# Patient Record
Sex: Female | Born: 2000 | Race: White | Hispanic: No | Marital: Single | Smoking: Never smoker
Health system: Southern US, Community
[De-identification: ages and names within clinical notes are randomized; demographics above are authoritative.]

## PROBLEM LIST (undated history)

## (undated) DIAGNOSIS — F32A Depression, unspecified: Secondary | ICD-10-CM

## (undated) DIAGNOSIS — F419 Anxiety disorder, unspecified: Secondary | ICD-10-CM

## (undated) HISTORY — PX: TONSILLECTOMY: SUR1361

## (undated) HISTORY — DX: Depression, unspecified: F32.A

## (undated) HISTORY — DX: Anxiety disorder, unspecified: F41.9

---
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
## 2019-03-14 ENCOUNTER — Emergency Department (HOSPITAL_BASED_OUTPATIENT_CLINIC_OR_DEPARTMENT_OTHER)
Admission: EM | Admit: 2019-03-14 | Discharge: 2019-03-14 | Disposition: A | Discharge status: Home / Self Care | Payer: No Typology Code available for payment source | Attending: Emergency Medicine | Admitting: Emergency Medicine

## 2019-03-14 ENCOUNTER — Encounter (HOSPITAL_BASED_OUTPATIENT_CLINIC_OR_DEPARTMENT_OTHER): Payer: Self-pay

## 2019-03-14 ENCOUNTER — Emergency Department (HOSPITAL_BASED_OUTPATIENT_CLINIC_OR_DEPARTMENT_OTHER): Payer: No Typology Code available for payment source

## 2019-03-14 ENCOUNTER — Other Ambulatory Visit: Payer: Self-pay

## 2019-03-14 DIAGNOSIS — Y92410 Unspecified street and highway as the place of occurrence of the external cause: Secondary | ICD-10-CM | POA: Insufficient documentation

## 2019-03-14 DIAGNOSIS — Y9389 Activity, other specified: Secondary | ICD-10-CM | POA: Diagnosis not present

## 2019-03-14 DIAGNOSIS — S161XXA Strain of muscle, fascia and tendon at neck level, initial encounter: Secondary | ICD-10-CM | POA: Diagnosis not present

## 2019-03-14 DIAGNOSIS — Y999 Unspecified external cause status: Secondary | ICD-10-CM | POA: Diagnosis not present

## 2019-03-14 DIAGNOSIS — S199XXA Unspecified injury of neck, initial encounter: Secondary | ICD-10-CM | POA: Diagnosis present
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
## 2019-03-14 NOTE — ED Notes (Signed)
Patient transported to X-ray 
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
## 2019-03-14 NOTE — Discharge Instructions (Addendum)
Ibuprofen 600 mg every 6 hours as needed for pain.  Follow-up with your primary doctor if you are not improving in the next week.

## 2019-03-21 ENCOUNTER — Ambulatory Visit: Payer: Self-pay | Admitting: Internal Medicine

## 2019-03-21 ENCOUNTER — Encounter: Payer: Self-pay | Admitting: Internal Medicine

## 2019-03-21 ENCOUNTER — Other Ambulatory Visit: Payer: Self-pay

## 2019-03-21 VITALS — BP 124/82 | HR 102 | Temp 98.6°F | Wt 183.0 lb

## 2019-03-21 DIAGNOSIS — M542 Cervicalgia: Secondary | ICD-10-CM

## 2019-03-21 DIAGNOSIS — M546 Pain in thoracic spine: Secondary | ICD-10-CM

## 2019-03-21 DIAGNOSIS — M25512 Pain in left shoulder: Secondary | ICD-10-CM

## 2019-03-21 MED ORDER — CYCLOBENZAPRINE HCL 5 MG PO TABS: 5.0000 mg | ORAL_TABLET | Freq: Three times a day (TID) | ORAL | 0 refills | Status: DC | PRN

## 2019-03-21 NOTE — Patient Instructions (Signed)
Neck Exercises  Neck exercises can be important for many reasons:   They can help you to improve and maintain flexibility in your neck. This can be especially important as you age.   They can help to make your neck stronger. This can make movement easier.   They can reduce or prevent neck pain.   They may help your upper back.  Ask your health care provider which neck exercises would be best for you.  Exercises to improve neck flexibility  Neck stretch  Repeat this exercise 3-5 times.  1. Do this exercise while standing or while sitting in a chair.  2. Place your feet flat on the floor, shoulder-width apart.  3. Slowly turn your head to the right. Turn it all the way to the right so you can look over your right shoulder. Do not tilt or tip your head.  4. Hold this position for 10-30 seconds.  5. Slowly turn your head to the left, to look over your left shoulder.  6. Hold this position for 10-30 seconds.    Neck retraction  Repeat this exercise 8-10 times. Do this 3-4 times a day or as told by your health care provider.  1. Do this exercise while standing or while sitting in a sturdy chair.  2. Look straight ahead. Do not bend your neck.  3. Use your fingers to push your chin backward. Do not bend your neck for this movement. Continue to face straight ahead. If you are doing the exercise properly, you will feel a slight sensation in your throat and a stretch at the back of your neck.  4. Hold the stretch for 1-2 seconds. Relax and repeat.  Exercises to improve neck strength  Neck press  Repeat this exercise 10 times. Do it first thing in the morning and right before bed or as told by your health care provider.  1. Lie on your back on a firm bed or on the floor with a pillow under your head.  2. Use your neck muscles to push your head down on the pillow and straighten your spine.  3. Hold the position as well as you can. Keep your head facing up and your chin tucked.  4. Slowly count to 5 while holding this  position.  5. Relax for a few seconds. Then repeat.  Isometric strengthening  Do a full set of these exercises 2 times a day or as told by your health care provider.  1. Sit in a supportive chair and place your hand on your forehead.  2. Push forward with your head and neck while pushing back with your hand. Hold for 10 seconds.  3. Relax. Then repeat the exercise 3 times.  4. Next, do thesequence again, this time putting your hand against the back of your head. Use your head and neck to push backward against the hand pressure.  5. Finally, do the same exercise on either side of your head, pushing sideways against the pressure of your hand.  Prone head lifts  Repeat this exercise 5 times. Do this 2 times a day or as told by your health care provider.  1. Lie face-down, resting on your elbows so that your chest and upper back are raised.  2. Start with your head facing downward, near your chest. Position your chin either on or near your chest.  3. Slowly lift your head upward. Lift until you are looking straight ahead. Then continue lifting your head as far back as you   can stretch.  4. Hold your head up for 5 seconds. Then slowly lower it to your starting position.  Supine head lifts  Repeat this exercise 8-10 times. Do this 2 times a day or as told by your health care provider.  1. Lie on your back, bending your knees to point to the ceiling and keeping your feet flat on the floor.  2. Lift your head slowly off the floor, raising your chin toward your chest.  3. Hold for 5 seconds.  4. Relax and repeat.  Scapular retraction  Repeat this exercise 5 times. Do this 2 times a day or as told by your health care provider.  1. Stand with your arms at your sides. Look straight ahead.  2. Slowly pull both shoulders backward and downward until you feel a stretch between your shoulder blades in your upper back.  3. Hold for 10-30 seconds.  4. Relax and repeat.  Contact a health care provider if:   Your neck pain or  discomfort gets much worse when you do an exercise.   Your neck pain or discomfort does not improve within 2 hours after you exercise.  If you have any of these problems, stop exercising right away. Do not do the exercises again unless your health care provider says that you can.  Get help right away if:   You develop sudden, severe neck pain. If this happens, stop exercising right away. Do not do the exercises again unless your health care provider says that you can.  This information is not intended to replace advice given to you by your health care provider. Make sure you discuss any questions you have with your health care provider.  Document Released: 09/07/2015 Document Revised: 01/30/2018 Document Reviewed: 04/06/2015  Elsevier Interactive Patient Education  2019 [employer]
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
## 2020-11-27 ENCOUNTER — Encounter: Payer: Self-pay | Admitting: Internal Medicine

## 2020-11-27 ENCOUNTER — Ambulatory Visit: Payer: Self-pay | Admitting: Internal Medicine

## 2020-11-27 ENCOUNTER — Other Ambulatory Visit: Payer: Self-pay

## 2020-11-27 VITALS — BP 118/76 | HR 89 | Temp 97.6°F | Wt 170.0 lb

## 2020-11-27 DIAGNOSIS — F419 Anxiety disorder, unspecified: Secondary | ICD-10-CM

## 2020-11-27 DIAGNOSIS — F32A Depression, unspecified: Secondary | ICD-10-CM

## 2020-11-27 MED ORDER — ESCITALOPRAM OXALATE 5 MG PO TABS: 5.0000 mg | ORAL_TABLET | Freq: Every day | ORAL | 1 refills | Status: DC

## 2020-11-27 MED ORDER — HYDROXYZINE HCL 10 MG PO TABS: 10.0000 mg | ORAL_TABLET | Freq: Every day | ORAL | 0 refills | Status: DC | PRN

## 2020-11-27 NOTE — Assessment & Plan Note (Signed)
Deteriorated Support offered today She declines referral for therapy Discussed medication management, side effects, risk of SI RX for Lexapro 5 mg QHS RX for Hydroxyzine 10 mg daily prn for breakthrough  Update me in 1 month via mychart

## 2020-11-27 NOTE — Progress Notes (Signed)
Subjective:    Patient ID: Brandy House, female    DOB: [birth year], 19 y.o.   MRN: [id number]  HPI  Pt presents to the clinic today with c/o anxiety and depression. She reports this has been a persistent issue for years but seemed to worsen after she moved to [place] in July. She reports general increase in stress between work/school and reports traffic causes her major anxiety, and the traffic in [place] is terrible. She has never been treated for anxiety in the past. She denies SI/HI.   Review of Systems      No past medical history on file.  Current Outpatient Medications  Medication Sig Dispense Refill  . cyclobenzaprine (FLEXERIL) 5 MG tablet Take 1 tablet (5 mg total) by mouth 3 (three) times daily as needed for muscle spasms. 30 tablet 0   No current facility-administered medications for this visit.    No Known Allergies  No family history on file.  Social History   Socioeconomic History  . Marital status: Single    Spouse name: Not on file  . Number of children: Not on file  . Years of education: Not on file  . Highest education level: Not on file  Occupational History  . Not on file  Tobacco Use  . Smoking status: Never Smoker  . Smokeless tobacco: Never Used  Vaping Use  . Vaping Use: Never used  Substance and Sexual Activity  . Alcohol use: Yes    Comment: occ  . Drug use: Never  . Sexual activity: Not on file  Other Topics Concern  . Not on file  Social History Narrative  . Not on file   Social Determinants of Health   Financial Resource Strain: Not on file  Food Insecurity: Not on file  Transportation Needs: Not on file  Physical Activity: Not on file  Stress: Not on file  Social Connections: Not on file  Intimate Partner Violence: Not on file     Constitutional: Denies fever, malaise, fatigue, headache or abrupt weight changes.  Respiratory: Denies difficulty breathing, shortness of breath, cough or sputum production.    Cardiovascular: Denies chest pain, chest tightness, palpitations or swelling in the hands or feet.  Neurological: Denies dizziness, difficulty with memory, difficulty with speech or problems with balance and coordination.  Psych: Pt reports anxiety. Denies depression, SI/HI.  No other specific complaints in a complete review of systems (except as listed in HPI above).  Objective:   Physical Exam  BP 118/76   Pulse 89   Temp 97.6 F (36.4 C) (Temporal)   Wt 170 lb (77.1 kg)   LMP 10/29/2020   SpO2 98%   BMI 23.71 kg/m   Wt Readings from Last 3 Encounters:  03/21/19 183 lb (83 kg) (95 %, Z= 1.69)*  03/14/19 184 lb (83.5 kg) (96 %, Z= 1.71)*  07/18/13 157 lb 10.1 oz (71.5 kg) (96 %, Z= 1.79)*   * Growth percentiles are based on CDC (Girls, 2-20 Years) data.    General: Appears her stated age, obese, in NAD. Cardiovascular: Normal rate and rhythm. S1,S2 noted.  No murmur, rubs or gallops noted.  Pulmonary/Chest: Normal effort and positive vesicular breath sounds. No respiratory distress. No wheezes, rales or ronchi noted.  Neurological: Alert and oriented. Cranial nerves II-XII grossly intact. Coordination normal.  Psychiatric: Mildly anxious appearing, tearful. Behavior is normal. Judgment and thought content normal.      Assessment & Plan:    Nicki Reaper, NP This visit occurred  during the SARS-CoV-2 public health emergency.  Safety protocols were in place, including screening questions prior to the visit, additional usage of staff PPE, and extensive cleaning of exam room while observing appropriate contact time as indicated for disinfecting solutions.

## 2020-11-27 NOTE — Patient Instructions (Signed)
[email or web address]">  Generalized Anxiety Disorder, Adult Generalized anxiety disorder (GAD) is a mental health condition. Unlike normal worries, anxiety related to GAD is not triggered by a specific event. These worries do not fade or get better with time. GAD interferes with relationships, work, and school. GAD symptoms can vary from mild to severe. People with severe GAD can have intense waves of anxiety with physical symptoms that are similar to panic attacks. What are the causes? The exact cause of GAD is not known, but the following are believed to have an impact:  Differences in natural brain chemicals.  Genes passed down from parents to children.  Differences in the way threats are perceived.  Development during childhood.  Personality. What increases the risk? The following factors may make you more likely to develop this condition:  Being female.  Having a family history of anxiety disorders.  Being very shy.  Experiencing very stressful life events, such as the death of a loved one.  Having a very stressful family environment. What are the signs or symptoms? People with GAD often worry excessively about many things in their lives, such as their health and family. Symptoms may also include:  Mental and emotional symptoms: ? Worrying excessively about natural disasters. ? Fear of being late. ? Difficulty concentrating. ? Fears that others are judging your performance.  Physical symptoms: ? Fatigue. ? Headaches, muscle tension, muscle twitches, trembling, or feeling shaky. ? Feeling like your heart is pounding or beating very fast. ? Feeling out of breath or like you cannot take a deep breath. ? Having trouble falling asleep or staying asleep, or experiencing restlessness. ? Sweating. ? Nausea, diarrhea, or irritable bowel syndrome (IBS).  Behavioral symptoms: ? Experiencing erratic moods or irritability. ? Avoidance of new situations. ? Avoidance of  people. ? Extreme difficulty making decisions. How is this diagnosed? This condition is diagnosed based on your symptoms and medical history. You will also have a physical exam. Your health care provider may perform tests to rule out other possible causes of your symptoms. To be diagnosed with GAD, a person must have anxiety that:  Is out of his or her control.  Affects several different aspects of his or her life, such as work and relationships.  Causes distress that makes him or her unable to take part in normal activities.  Includes at least three symptoms of GAD, such as restlessness, fatigue, trouble concentrating, irritability, muscle tension, or sleep problems. Before your health care provider can confirm a diagnosis of GAD, these symptoms must be present more days than they are not, and they must last for 6 months or longer. How is this treated? This condition may be treated with:  Medicine. Antidepressant medicine is usually prescribed for long-term daily control. Anti-anxiety medicines may be added in severe cases, especially when panic attacks occur.  Talk therapy (psychotherapy). Certain types of talk therapy can be helpful in treating GAD by providing support, education, and guidance. Options include: ? Cognitive behavioral therapy (CBT). People learn coping skills and self-calming techniques to ease their physical symptoms. They learn to identify unrealistic thoughts and behaviors and to replace them with more appropriate thoughts and behaviors. ? Acceptance and commitment therapy (ACT). This treatment teaches people how to be mindful as a way to cope with unwanted thoughts and feelings. ? Biofeedback. This process trains you to manage your body's response (physiological response) through breathing techniques and relaxation methods. You will work with a therapist while machines are used to monitor your physical   symptoms.  Stress management techniques. These include yoga,  meditation, and exercise. A mental health specialist can help determine which treatment is best for you. Some people see improvement with one type of therapy. However, other people require a combination of therapies.   Follow these instructions at home: Lifestyle  Maintain a consistent routine and schedule.  Anticipate stressful situations. Create a plan, and allow extra time to work with your plan.  Practice stress management or self-calming techniques that you have learned from your therapist or your health care provider. General instructions  Take over-the-counter and prescription medicines only as told by your health care provider.  Understand that you are likely to have setbacks. Accept this and be kind to yourself as you persist to take better care of yourself.  Recognize and accept your accomplishments, even if you judge them as small.  Keep all follow-up visits as told by your health care provider. This is important. Contact a health care provider if:  Your symptoms do not get better.  Your symptoms get worse.  You have signs of depression, such as: ? A persistently sad or irritable mood. ? Loss of enjoyment in activities that used to bring you joy. ? Change in weight or eating. ? Changes in sleeping habits. ? Avoiding friends or family members. ? Loss of energy for normal tasks. ? Feelings of guilt or worthlessness. Get help right away if:  You have serious thoughts about hurting yourself or others. If you ever feel like you may hurt yourself or others, or have thoughts about taking your own life, get help right away. Go to your nearest emergency department or:  Call your local emergency services (911 in the [place]  Call a suicide crisis helpline, such as the National Suicide Prevention Lifeline at 1-800-273-8255. This is open 24 hours a day in the [place]  Text the Crisis Text Line at 741741 (in the [place] Summary  Generalized anxiety disorder (GAD) is a mental  health condition that involves worry that is not triggered by a specific event.  People with GAD often worry excessively about many things in their lives, such as their health and family.  GAD may cause symptoms such as restlessness, trouble concentrating, sleep problems, frequent sweating, nausea, diarrhea, headaches, and trembling or muscle twitching.  A mental health specialist can help determine which treatment is best for you. Some people see improvement with one type of therapy. However, other people require a combination of therapies. This information is not intended to replace advice given to you by your health care provider. Make sure you discuss any questions you have with your health care provider. Document Revised: 07/17/2019 Document Reviewed: 07/17/2019 Elsevier Patient Education  2021 Elsevier Inc.  

## 2020-12-25 ENCOUNTER — Encounter: Payer: Self-pay | Admitting: Internal Medicine

## 2021-01-17 ENCOUNTER — Other Ambulatory Visit: Payer: Self-pay | Admitting: Internal Medicine

## 2021-02-17 ENCOUNTER — Other Ambulatory Visit: Payer: Self-pay | Admitting: Internal Medicine

## 2021-04-10 ENCOUNTER — Encounter: Payer: Self-pay | Admitting: Internal Medicine

## 2021-04-18 ENCOUNTER — Other Ambulatory Visit: Payer: Self-pay | Admitting: Internal Medicine

## 2021-04-19 NOTE — Telephone Encounter (Signed)
Requested medication (s) are due for refill today: Yes  Requested medication (s) are on the active medication list: Yes  Last refill:  01/18/21  Future visit scheduled: No  Notes to clinic:  Left pt. Message to call and make appointment.    Requested Prescriptions  Pending Prescriptions Disp Refills   escitalopram (LEXAPRO) 5 MG tablet [Pharmacy Med Name: ESCITALOPRAM 5 MG TABLET] 90 tablet 1    Sig: TAKE 1 TABLET (5 MG TOTAL) BY MOUTH DAILY.      There is no refill protocol information for this order

## 2021-05-28 ENCOUNTER — Encounter: Payer: Self-pay | Admitting: Internal Medicine

## 2021-06-02 MED ORDER — ESCITALOPRAM OXALATE 10 MG PO TABS: 10.0000 mg | ORAL_TABLET | Freq: Every day | ORAL | 1 refills | Status: DC

## 2021-07-09 ENCOUNTER — Other Ambulatory Visit: Payer: Self-pay | Admitting: Internal Medicine

## 2021-08-29 ENCOUNTER — Other Ambulatory Visit: Payer: Self-pay | Admitting: Internal Medicine

## 2021-08-29 NOTE — Telephone Encounter (Signed)
Requested medication (s) are due for refill today: yes  Requested medication (s) are on the active medication list: yes  Last refill:  02/19/21 #90 1 RF  Future visit scheduled: no  Notes to clinic:  pt of Nicki Reaper NP; med not assigned to a protocol   Requested Prescriptions  Pending Prescriptions Disp Refills   hydrOXYzine (ATARAX/VISTARIL) 10 MG tablet [Pharmacy Med Name: HYDROXYZINE HCL 10 MG TABLET] 90 tablet 1    Sig: TAKE 1 TABLET BY MOUTH EVERY DAY AS NEEDED     There is no refill protocol information for this order

## 2021-10-12 ENCOUNTER — Encounter: Payer: Self-pay | Admitting: Internal Medicine

## 2021-10-15 ENCOUNTER — Ambulatory Visit: Payer: Self-pay | Admitting: Internal Medicine

## 2021-10-15 ENCOUNTER — Other Ambulatory Visit: Payer: Self-pay

## 2021-10-15 ENCOUNTER — Encounter: Payer: Self-pay | Admitting: Internal Medicine

## 2021-10-15 DIAGNOSIS — F32A Depression, unspecified: Secondary | ICD-10-CM

## 2021-10-15 DIAGNOSIS — F419 Anxiety disorder, unspecified: Secondary | ICD-10-CM

## 2021-10-15 MED ORDER — ESCITALOPRAM OXALATE 20 MG PO TABS: 20.0000 mg | ORAL_TABLET | Freq: Every day | ORAL | 1 refills | Status: DC

## 2021-10-15 NOTE — Progress Notes (Signed)
Subjective:    Patient ID: Brandy House, female    DOB: March 24, 2001, 21 y.o.   MRN: [id number]  HPI  Pt presents to the clinic today for follow up of anxiety and depression. This is currently managed on Escitalopram and Hydroxyzine. She does feel like her anxiety has gotten worse lately. She reports the depression is still there but not worse. She is not currently seeing a therapist but has been considering this. She denies SI/HI.  Review of Systems     No past medical history on file.  Current Outpatient Medications  Medication Sig Dispense Refill   escitalopram (LEXAPRO) 10 MG tablet Take 1 tablet (10 mg total) by mouth daily. 90 tablet 1   hydrOXYzine (ATARAX/VISTARIL) 10 MG tablet TAKE 1 TABLET BY MOUTH EVERY DAY AS NEEDED 90 tablet 0   levonorgestrel-ethinyl estradiol (ALESSE) 0.1-20 MG-MCG tablet Take 1 tablet by mouth daily.     No current facility-administered medications for this visit.    No Known Allergies  No family history on file.  Social History   Socioeconomic History   Marital status: Single    Spouse name: Not on file   Number of children: Not on file   Years of education: Not on file   Highest education level: Not on file  Occupational History   Not on file  Tobacco Use   Smoking status: Never   Smokeless tobacco: Never  Vaping Use   Vaping Use: Never used  Substance and Sexual Activity   Alcohol use: Yes    Comment: occ   Drug use: Never   Sexual activity: Not on file  Other Topics Concern   Not on file  Social History Narrative   Not on file   Social Determinants of Health   Financial Resource Strain: Not on file  Food Insecurity: Not on file  Transportation Needs: Not on file  Physical Activity: Not on file  Stress: Not on file  Social Connections: Not on file  Intimate Partner Violence: Not on file     Constitutional: Denies fever, malaise, fatigue, headache or abrupt weight changes.  Respiratory: Denies difficulty breathing,  shortness of breath, cough or sputum production.   Cardiovascular: Denies chest pain, chest tightness, palpitations or swelling in the hands or feet.  Neurological: Denies dizziness, difficulty with memory, difficulty with speech or problems with balance and coordination.  Psych: Pt has a history of anxiety and depression. Denies SI/HI.  No other specific complaints in a complete review of systems (except as listed in HPI above).  Objective:   Physical Exam BP 115/78 (BP Location: Right Arm, Patient Position: Sitting, Cuff Size: Normal)    Pulse 74    Temp (!) 97.3 F (36.3 C) (Temporal)    Resp 18    Ht 5\' 11"  (1.803 m)    Wt 189 lb 9.6 oz (86 kg)    LMP 10/05/2021    SpO2 100%    BMI 26.44 kg/m   Wt Readings from Last 3 Encounters:  11/27/20 170 lb (77.1 kg)  03/21/19 183 lb (83 kg) (95 %, Z= 1.69)*  03/14/19 184 lb (83.5 kg) (96 %, Z= 1.71)*   * Growth percentiles are based on CDC (Girls, 2-20 Years) data.    General: Appears her stated age, well developed, well nourished in NAD. Cardiovascular: Normal rate and rhythm.  Pulmonary/Chest: Normal effort and positive vesicular breath sounds.  Musculoskeletal: No difficulty with gait.  Neurological: Alert and oriented.  Psychiatric: Mood and affect normal.  Mildly anxious appearing. Judgment and thought content normal.       Assessment & Plan:     Nicki Reaper, NP This visit occurred during the SARS-CoV-2 public health emergency.  Safety protocols were in place, including screening questions prior to the visit, additional usage of staff PPE, and extensive cleaning of exam room while observing appropriate contact time as indicated for disinfecting solutions.

## 2021-10-15 NOTE — Assessment & Plan Note (Signed)
Deteriorated Increase Escitalopram to 20 mg daily Continue Hydroxyzine as needed She is considering therapy through [email or web address] Support offered

## 2021-10-15 NOTE — Patient Instructions (Signed)

## 2021-11-16 ENCOUNTER — Encounter: Payer: Self-pay | Admitting: Internal Medicine

## 2022-04-21 ENCOUNTER — Other Ambulatory Visit: Payer: Self-pay | Admitting: Internal Medicine

## 2022-04-21 NOTE — Telephone Encounter (Signed)
Called pt , LMOM to return call to schedule appt. ?

## 2022-04-21 NOTE — Telephone Encounter (Signed)
Courtesy refill. Patient will need an office visit for further refills. Requested Prescriptions  Pending Prescriptions Disp Refills  . escitalopram (LEXAPRO) 20 MG tablet [Pharmacy Med Name: ESCITALOPRAM 20 MG TABLET] 30 tablet 0    Sig: TAKE 1 TABLET BY MOUTH EVERY DAY     Psychiatry:  Antidepressants - SSRI Failed - 04/21/2022  3:27 AM      Failed - Valid encounter within last 6 months    Recent Outpatient Visits          6 months ago Anxiety and depression   [facility] [place], [place], NP             Passed - Completed PHQ-2 or PHQ-9 in the last 360 days

## 2022-05-02 ENCOUNTER — Encounter: Payer: Self-pay | Admitting: Internal Medicine

## 2022-05-02 ENCOUNTER — Telehealth (INDEPENDENT_AMBULATORY_CARE_PROVIDER_SITE_OTHER): Payer: Self-pay | Admitting: Internal Medicine

## 2022-05-02 DIAGNOSIS — F419 Anxiety disorder, unspecified: Secondary | ICD-10-CM

## 2022-05-02 DIAGNOSIS — F32A Depression, unspecified: Secondary | ICD-10-CM

## 2022-05-02 MED ORDER — HYDROXYZINE HCL 10 MG PO TABS: 10.0000 mg | ORAL_TABLET | Freq: Every day | ORAL | 3 refills | Status: DC | PRN

## 2022-05-02 MED ORDER — ESCITALOPRAM OXALATE 20 MG PO TABS: 20.0000 mg | ORAL_TABLET | Freq: Every day | ORAL | 3 refills | Status: DC

## 2022-05-02 NOTE — Assessment & Plan Note (Signed)
Controlled on her current dose of escitalopram and hydroxyzine, refilled today Support offered

## 2022-05-02 NOTE — Progress Notes (Signed)
Virtual Visit via Video Note  I connected with Brandy House on 05/02/22 at 11:20 AM EDT by a video enabled telemedicine application and verified that I am speaking with the correct person using two identifiers.  Location: Patient: Home Provider: Office  Persons participating in this video call: Nicki Reaper, NP and Lucius Conn.   I discussed the limitations of evaluation and management by telemedicine and the availability of in person appointments. The patient expressed understanding and agreed to proceed.  History of Present Illness:  Patient due for follow-up of anxiety and depression.  This is a chronic issue which is currently managed on Escitalopram and Hydroxyzine.  She is not currently seeing a therapist but did do therapy through [email or web address].  She denies SI/HI.   No past medical history on file.  Current Outpatient Medications  Medication Sig Dispense Refill   escitalopram (LEXAPRO) 20 MG tablet TAKE 1 TABLET BY MOUTH EVERY DAY 30 tablet 0   hydrOXYzine (ATARAX/VISTARIL) 10 MG tablet TAKE 1 TABLET BY MOUTH EVERY DAY AS NEEDED 90 tablet 0   No current facility-administered medications for this visit.    No Known Allergies  No family history on file.  Social History   Socioeconomic History   Marital status: Single    Spouse name: Not on file   Number of children: Not on file   Years of education: Not on file   Highest education level: Not on file  Occupational History   Not on file  Tobacco Use   Smoking status: Never   Smokeless tobacco: Never  Vaping Use   Vaping Use: Never used  Substance and Sexual Activity   Alcohol use: Yes    Comment: occ   Drug use: Never   Sexual activity: Not on file  Other Topics Concern   Not on file  Social History Narrative   Not on file   Social Determinants of Health   Financial Resource Strain: Not on file  Food Insecurity: Not on file  Transportation Needs: Not on file  Physical Activity: Not on file   Stress: Not on file  Social Connections: Not on file  Intimate Partner Violence: Not on file     Constitutional: Denies fever, malaise, fatigue, headache or abrupt weight changes.  Respiratory: Denies difficulty breathing, shortness of breath, cough or sputum production.   Cardiovascular: Denies chest pain, chest tightness, palpitations or swelling in the hands or feet.   Neurological: Denies dizziness, difficulty with memory, difficulty with speech or problems with balance and coordination.  Psych: Patient has a history of anxiety and depression.  Denies SI/HI.  No other specific complaints in a complete review of systems (except as listed in HPI above).  Observations/Objective:  Wt Readings from Last 3 Encounters:  10/15/21 189 lb 9.6 oz (86 kg)  11/27/20 170 lb (77.1 kg)  03/21/19 183 lb (83 kg) (95 %, Z= 1.69)*   * Growth percentiles are based on CDC (Girls, 2-20 Years) data.    General: Appears her stated age, well developed, well nourished in NAD. Pulmonary/Chest: Normal effort. No respiratory distress.  Neurological: Alert and oriented.  Psychiatric: Mood and affect normal. Behavior is normal. Judgment and thought content normal.       Assessment and Plan:  RTC in 1 year for your annual exam  Follow Up Instructions:    I discussed the assessment and treatment plan with the patient. The patient was provided an opportunity to ask questions and all were answered. The patient agreed with the  plan and demonstrated an understanding of the instructions.   The patient was advised to call back or seek an in-person evaluation if the symptoms worsen or if the condition fails to improve as anticipated.    Nicki Reaper, NP

## 2022-05-02 NOTE — Patient Instructions (Signed)

## 2022-06-01 ENCOUNTER — Encounter: Payer: Self-pay | Admitting: Internal Medicine

## 2022-06-21 ENCOUNTER — Encounter: Payer: Self-pay | Admitting: Internal Medicine

## 2022-06-21 ENCOUNTER — Ambulatory Visit: Payer: Self-pay | Admitting: Internal Medicine

## 2022-06-21 DIAGNOSIS — F419 Anxiety disorder, unspecified: Secondary | ICD-10-CM

## 2022-06-21 DIAGNOSIS — F32A Depression, unspecified: Secondary | ICD-10-CM

## 2022-06-21 MED ORDER — HYDROXYZINE PAMOATE 25 MG PO CAPS: 25.0000 mg | ORAL_CAPSULE | Freq: Three times a day (TID) | ORAL | 1 refills | Status: DC | PRN

## 2022-06-21 MED ORDER — BUPROPION HCL ER (XL) 150 MG PO TB24: 150.0000 mg | ORAL_TABLET | Freq: Every day | ORAL | 1 refills | Status: DC

## 2022-06-21 NOTE — Progress Notes (Signed)
Subjective:    Patient ID: Brandy House, female    DOB: [birth year], 21 y.o.   MRN: [id number]  HPI  Patient presents to clinic today for follow-up of anxiety and depression.  This is a chronic issue currently managed on Escitalopram and Hydroxyzine.  She feels like this has been worse lately due to irrational fears about losing her job, having a bad day at work. She reports her house is dirty because she has no motivation to get up an clean it. She is not currently seeing a therapist, mainly due to financial limitations.  She denies SI/HI.  Review of Systems     No past medical history on file.  Current Outpatient Medications  Medication Sig Dispense Refill   escitalopram (LEXAPRO) 20 MG tablet Take 1 tablet (20 mg total) by mouth daily. 90 tablet 3   hydrOXYzine (ATARAX) 10 MG tablet Take 1 tablet (10 mg total) by mouth daily as needed. 90 tablet 3   No current facility-administered medications for this visit.    No Known Allergies  No family history on file.  Social History   Socioeconomic History   Marital status: Single    Spouse name: Not on file   Number of children: Not on file   Years of education: Not on file   Highest education level: Not on file  Occupational History   Not on file  Tobacco Use   Smoking status: Never   Smokeless tobacco: Never  Vaping Use   Vaping Use: Never used  Substance and Sexual Activity   Alcohol use: Yes    Comment: occ   Drug use: Never   Sexual activity: Not on file  Other Topics Concern   Not on file  Social History Narrative   Not on file   Social Determinants of Health   Financial Resource Strain: Not on file  Food Insecurity: Not on file  Transportation Needs: Not on file  Physical Activity: Not on file  Stress: Not on file  Social Connections: Not on file  Intimate Partner Violence: Not on file     Constitutional: Denies fever, malaise, fatigue, headache or abrupt weight changes.  Respiratory: Denies  difficulty breathing, shortness of breath, cough or sputum production.   Cardiovascular: Denies chest pain, chest tightness, palpitations or swelling in the hands or feet.  Neurological: Denies dizziness, difficulty with memory, difficulty with speech or problems with balance and coordination.  Psych: Patient has a history of anxiety and depression.  Denies  SI/HI.  No other specific complaints in a complete review of systems (except as listed in HPI above).  Objective:   Physical Exam  BP 132/84 (BP Location: Right Arm, Patient Position: Sitting, Cuff Size: Normal)   Pulse 87   Temp (!) 97.3 F (36.3 C) (Temporal)   Wt 184 lb (83.5 kg)   SpO2 100%   BMI 25.66 kg/m   Wt Readings from Last 3 Encounters:  10/15/21 189 lb 9.6 oz (86 kg)  11/27/20 170 lb (77.1 kg)  03/21/19 183 lb (83 kg) (95 %, Z= 1.69)*   * Growth percentiles are based on CDC (Girls, 2-20 Years) data.    General: Appears her stated age, overweight, in NAD. Cardiovascular: Normal rate and rhythm. S1,S2 noted.  No murmur, rubs or gallops noted.  Pulmonary/Chest: Normal effort and positive vesicular breath sounds. No respiratory distress. No wheezes, rales or ronchi noted.  Neurological: Alert and oriented.  Psychiatric: Mood and affect normal. Anxious appearing. Judgment and thought content  normal.        Assessment & Plan:     RTC in 6 months for follow up of chronic conditions Nicki Reaper, NP

## 2022-06-21 NOTE — Patient Instructions (Signed)

## 2022-06-21 NOTE — Assessment & Plan Note (Signed)
Deteriorated Continue escitalopram Will add buproprion 150 mg XL daily Will increase hydroxyzine to 25 mg TID prn She is not interested in referral to psychology at this time Support offered

## 2022-07-07 ENCOUNTER — Encounter: Payer: Self-pay | Admitting: Internal Medicine

## 2022-07-15 ENCOUNTER — Other Ambulatory Visit: Payer: Self-pay | Admitting: Internal Medicine

## 2022-07-15 NOTE — Telephone Encounter (Signed)
Requested medications are due for refill today.  no  Requested medications are on the active medications list.  yes  Last refill. 9/12/20203 #90 1 rf  Future visit scheduled.   no  Notes to clinic.  Pt requesting a 90 day supply.    Requested Prescriptions  Pending Prescriptions Disp Refills   hydrOXYzine (VISTARIL) 25 MG capsule [Pharmacy Med Name: HYDROXYZINE PAM 25 MG CAP] 270 capsule 1    Sig: Take 1 capsule (25 mg total) by mouth every 8 (eight) hours as needed.     Ear, Nose, and Throat:  Antihistamines 2 Failed - 07/15/2022 12:30 PM      Failed - Cr in normal range and within 360 days    No results found for: "CREATININE", "LABCREAU", "LABCREA", "POCCRE"       Passed - Valid encounter within last 12 months    Recent Outpatient Visits           3 weeks ago Anxiety and depression   [facility], Coralie Keens, NP   2 months ago Anxiety and depression   [facility] [place], Coralie Keens, NP   9 months ago Anxiety and depression   [facility] [place], Coralie Keens, [place]

## 2022-11-09 ENCOUNTER — Encounter: Payer: Self-pay | Admitting: Internal Medicine

## 2022-11-10 MED ORDER — ESCITALOPRAM OXALATE 10 MG PO TABS: 10.0000 mg | ORAL_TABLET | Freq: Every day | ORAL | 0 refills | Status: DC

## 2022-11-12 ENCOUNTER — Other Ambulatory Visit: Payer: Self-pay | Admitting: Internal Medicine

## 2022-11-14 NOTE — Telephone Encounter (Signed)
Requested medication (s) are due for refill today: yes  Requested medication (s) are on the active medication list: yes  Last refill:  06/22/22 #90 1 refills  Future visit scheduled: no  Notes to clinic:  protocol failed. No labs documented. Do you want to refill Rx?     Requested Prescriptions  Pending Prescriptions Disp Refills   buPROPion (WELLBUTRIN XL) 150 MG 24 hr tablet [Pharmacy Med Name: BUPROPION HCL XL 150 MG TABLET] 90 tablet 1    Sig: TAKE 1 TABLET BY MOUTH EVERY DAY     Psychiatry: Antidepressants - bupropion Failed - 11/12/2022  1:05 AM      Failed - Cr in normal range and within 360 days    No results found for: "CREATININE", "LABCREAU", "LABCREA", "POCCRE"       Failed - AST in normal range and within 360 days    No results found for: "POCAST", "AST"       Failed - ALT in normal range and within 360 days    No results found for: "ALT", "LABALT", "POCALT"       Passed - Completed PHQ-2 or PHQ-9 in the last 360 days      Passed - Last BP in normal range    BP Readings from Last 1 Encounters:  06/21/22 132/84         Passed - Valid encounter within last 6 months    Recent Outpatient Visits           4 months ago Anxiety and depression   [place], Coralie Keens, NP   6 months ago Anxiety and depression   [facility] [place], Coralie Keens, NP   1 year ago Anxiety and depression   [facility] [place], Coralie Keens, [place]

## 2022-11-24 ENCOUNTER — Encounter: Payer: Self-pay | Admitting: Internal Medicine

## 2022-11-25 MED ORDER — ESCITALOPRAM OXALATE 10 MG PO TABS: 10.0000 mg | ORAL_TABLET | Freq: Every day | ORAL | 0 refills | Status: DC

## 2022-11-25 NOTE — Addendum Note (Signed)
Addended by: Ashley Royalty E on: 11/25/2022 02:30 PM   Modules accepted: Orders

## 2023-02-15 ENCOUNTER — Other Ambulatory Visit: Payer: Self-pay | Admitting: Internal Medicine

## 2023-02-15 NOTE — Telephone Encounter (Signed)
Requested medication (s) are due for refill today: Due 02/23/23  Requested medication (s) are on the active medication list: yes    Last refill: 11/25/22  #90  0 refills  Future visit scheduled no  Notes to clinic:Pt needs appt, attempted to reach to schedule. Left VM to call back.  Requested Prescriptions  Pending Prescriptions Disp Refills   escitalopram (LEXAPRO) 10 MG tablet [Pharmacy Med Name: ESCITALOPRAM 10 MG TABLET] 90 tablet 0    Sig: TAKE 1 TABLET BY MOUTH EVERY DAY     Psychiatry:  Antidepressants - SSRI Failed - 02/15/2023  2:25 AM      Failed - Valid encounter within last 6 months    Recent Outpatient Visits           7 months ago Anxiety and depression   [place] [facility] [place], Salvadore [place], NP   9 months ago Anxiety and depression   [place] [facility] [place], Salvadore Oxford, NP   1 year ago Anxiety and depression   [place] [facility], [place], [place]              Passed - Completed PHQ-2 or PHQ-9 in the last 360 days

## 2023-02-16 ENCOUNTER — Other Ambulatory Visit: Payer: Self-pay | Admitting: Internal Medicine

## 2023-02-16 NOTE — Telephone Encounter (Signed)
Pt due appt  and updated blood work.  Last RF 11/14/22  #90  Active med list: yes Future visit scheduled: no Refill due: yes  Called pt and LM on VM to call back to schedule appointment.   Requested Prescriptions  Pending Prescriptions Disp Refills   buPROPion (WELLBUTRIN XL) 150 MG 24 hr tablet [Pharmacy Med Name: BUPROPION HCL XL 150 MG TABLET] 90 tablet 0    Sig: TAKE 1 TABLET BY MOUTH EVERY DAY     Psychiatry: Antidepressants - bupropion Failed - 02/16/2023  2:43 AM      Failed - Cr in normal range and within 360 days    No results found for: "CREATININE", "LABCREAU", "LABCREA", "POCCRE"       Failed - AST in normal range and within 360 days    No results found for: "POCAST", "AST"       Failed - ALT in normal range and within 360 days    No results found for: "ALT", "LABALT", "POCALT"       Failed - Valid encounter within last 6 months    Recent Outpatient Visits           8 months ago Anxiety and depression   [place] [facility] [place], Salvadore Oxford, NP   9 months ago Anxiety and depression   [place] [facility] [place], Salvadore Oxford, NP   1 year ago Anxiety and depression   [facility] [place], [place], [place]              Passed - Completed PHQ-2 or PHQ-9 in the last 360 days      Passed - Last BP in normal range    BP Readings from Last 1 Encounters:  06/21/22 132/84

## 2023-02-22 ENCOUNTER — Encounter: Payer: Self-pay | Admitting: Internal Medicine

## 2023-02-22 ENCOUNTER — Ambulatory Visit (INDEPENDENT_AMBULATORY_CARE_PROVIDER_SITE_OTHER): Payer: Self-pay | Admitting: Internal Medicine

## 2023-02-22 VITALS — BP 114/72 | HR 87 | Temp 97.1°F | Ht 71.0 in | Wt 177.0 lb

## 2023-02-22 DIAGNOSIS — Z0001 Encounter for general adult medical examination with abnormal findings: Secondary | ICD-10-CM

## 2023-02-22 DIAGNOSIS — Z114 Encounter for screening for human immunodeficiency virus [HIV]: Secondary | ICD-10-CM

## 2023-02-22 DIAGNOSIS — Z1159 Encounter for screening for other viral diseases: Secondary | ICD-10-CM

## 2023-02-22 DIAGNOSIS — R7309 Other abnormal glucose: Secondary | ICD-10-CM

## 2023-02-22 MED ORDER — BUPROPION HCL ER (XL) 150 MG PO TB24: 150.0000 mg | ORAL_TABLET | Freq: Every day | ORAL | 3 refills | Status: DC

## 2023-02-22 MED ORDER — ESCITALOPRAM OXALATE 10 MG PO TABS: 10.0000 mg | ORAL_TABLET | Freq: Every day | ORAL | 3 refills | Status: DC

## 2023-02-22 NOTE — Progress Notes (Signed)
Subjective:    Patient ID: Brandy House, female    DOB: 2001-06-20, 22 y.o.   MRN: [id number]  HPI  Patient presents to clinic today for her annual exam.  Flu: never Tetanus: about 10 years ago COVID: x 2 Pap smear: never Dentist: as needed  Diet: She does eat some lean meat. She consumes fruits and veggies. She does eat some fried foods. She drinks mostly water, cherry coke. Exercise: Walking the dog  Review of Systems     No past medical history on file.  Current Outpatient Medications  Medication Sig Dispense Refill   buPROPion (WELLBUTRIN XL) 150 MG 24 hr tablet TAKE 1 TABLET BY MOUTH EVERY DAY 90 tablet 0   escitalopram (LEXAPRO) 10 MG tablet Take 1 tablet (10 mg total) by mouth daily. 90 tablet 0   hydrOXYzine (VISTARIL) 25 MG capsule TAKE 1 CAPSULE (25 MG TOTAL) BY MOUTH EVERY 8 (EIGHT) HOURS AS NEEDED. 270 capsule 0   No current facility-administered medications for this visit.    No Known Allergies  No family history on file.  Social History   Socioeconomic History   Marital status: Single    Spouse name: Not on file   Number of children: Not on file   Years of education: Not on file   Highest education level: 12th grade  Occupational History   Not on file  Tobacco Use   Smoking status: Never   Smokeless tobacco: Never  Vaping Use   Vaping Use: Never used  Substance and Sexual Activity   Alcohol use: Yes    Comment: occ   Drug use: Never   Sexual activity: Not on file  Other Topics Concern   Not on file  Social History Narrative   Not on file   Social Determinants of Health   Financial Resource Strain: Low Risk  (02/21/2023)   Overall Financial Resource Strain (CARDIA)    Difficulty of Paying Living Expenses: Not hard at all  Food Insecurity: No Food Insecurity (02/21/2023)   Hunger Vital Sign    Worried About Running Out of Food in the Last Year: Never true    Ran Out of Food in the Last Year: Never true  Transportation Needs: No  Transportation Needs (02/21/2023)   PRAPARE - Administrator, Civil Service (Medical): No    Lack of Transportation (Non-Medical): No  Physical Activity: Insufficiently Active (02/21/2023)   Exercise Vital Sign    Days of Exercise per Week: 7 days    Minutes of Exercise per Session: 20 min  Stress: No Stress Concern Present (02/21/2023)   [employer] of Occupational Health - Occupational Stress Questionnaire    Feeling of Stress : Only a little  Social Connections: Moderately Isolated (02/21/2023)   Social Connection and Isolation Panel [NHANES]    Frequency of Communication with Friends and Family: More than three times a week    Frequency of Social Gatherings with Friends and Family: More than three times a week    Attends Religious Services: Never    Database administrator or Organizations: No    Attends Engineer, structural: Not on file    Marital Status: Living with partner  Intimate Partner Violence: Not on file     Constitutional: Denies fever, malaise, fatigue, headache or abrupt weight changes.  HEENT: Denies eye pain, eye redness, ear pain, ringing in the ears, wax buildup, runny nose, nasal congestion, bloody nose, or sore throat. Respiratory: Denies difficulty breathing, shortness  of breath, cough or sputum production.   Cardiovascular: Denies chest pain, chest tightness, palpitations or swelling in the hands or feet.  Gastrointestinal: Denies abdominal pain, bloating, constipation, diarrhea or blood in the stool.  GU: Denies urgency, frequency, pain with urination, burning sensation, blood in urine, odor or discharge. Musculoskeletal: Denies decrease in range of motion, difficulty with gait, muscle pain or joint pain and swelling.  Skin: Denies redness, rashes, lesions or ulcercations.  Neurological: Denies dizziness, difficulty with memory, difficulty with speech or problems with balance and coordination.  Psych: Patient has a history of anxiety and  depression.  Denies SI/HI.  No other specific complaints in a complete review of systems (except as listed in HPI above).  Objective:   Physical Exam   BP 114/72 (BP Location: Left Arm, Patient Position: Sitting, Cuff Size: Normal)   Pulse 87   Temp (!) 97.1 F (36.2 C) (Temporal)   Ht 5\' 11"  (1.803 m)   Wt 177 lb (80.3 kg)   SpO2 100%   BMI 24.69 kg/m     General: Appears her stated age, well developed, well nourished in NAD. Skin: Warm, dry and intact.  HEENT: Head: normal shape and size; Eyes: sclera white, no icterus, conjunctiva pink, PERRLA and EOMs intact;  Neck:  Neck supple, trachea midline. No masses, lumps or thyromegaly present.  Cardiovascular: Normal rate and rhythm. S1,S2 noted.  No murmur, rubs or gallops noted. No JVD or BLE edema.  Pulmonary/Chest: Normal effort and positive vesicular breath sounds. No respiratory distress. No wheezes, rales or ronchi noted.  Abdomen: Normal bowel sounds.  Musculoskeletal: Strength 5/5 BUE/BLE. No difficulty with gait.  Neurological: Alert and oriented. Cranial nerves II-XII grossly intact. Coordination normal.  Psychiatric: Mood and affect normal. Behavior is normal. Judgment and thought content normal.         Assessment & Plan:   Preventative Health Maintenance:  Encouraged her to get a flu shot in the fall Tetanus declined Encouraged her to get her COVID-vaccine Pap smear declined today Encouraged her to consume a balanced diet and exercise regimen Advised her to see a dentist annually Labwork declined today  RTC in 1 year for your annual exam Nicki Reaper, NP

## 2023-02-22 NOTE — Patient Instructions (Signed)

## 2023-11-01 ENCOUNTER — Encounter: Payer: Self-pay | Admitting: Internal Medicine

## 2024-01-02 ENCOUNTER — Encounter: Payer: Self-pay | Admitting: Internal Medicine

## 2024-01-09 ENCOUNTER — Telehealth (INDEPENDENT_AMBULATORY_CARE_PROVIDER_SITE_OTHER): Payer: Self-pay | Admitting: Internal Medicine

## 2024-01-09 ENCOUNTER — Encounter: Payer: Self-pay | Admitting: Internal Medicine

## 2024-01-09 DIAGNOSIS — F32A Depression, unspecified: Secondary | ICD-10-CM

## 2024-01-09 DIAGNOSIS — F419 Anxiety disorder, unspecified: Secondary | ICD-10-CM

## 2024-01-09 MED ORDER — SERTRALINE HCL 50 MG PO TABS: 50.0000 mg | ORAL_TABLET | Freq: Every day | ORAL | 0 refills | Status: DC

## 2024-01-09 NOTE — Patient Instructions (Signed)
 Managing Stress, Adult Feeling a certain amount of stress is normal. Stress helps our body and mind get ready to deal with the demands of life. Stress hormones can motivate you to do well at work and meet your responsibilities. But severe or long-term (chronic) stress can affect your mental and physical health. Chronic stress puts you at higher risk for: Anxiety and depression. Other health problems such as digestive problems, muscle aches, heart disease, high blood pressure, and stroke. What are the causes? Common causes of stress include: Demands from work, such as deadlines, feeling overworked, or having long hours. Pressures at home, such as money issues, disagreements with a spouse, or parenting issues. Pressures from major life changes, such as divorce, moving, loss of a loved one, or chronic illness. You may be at higher risk for stress-related problems if you: Do not get enough sleep. Are in poor health. Do not have emotional support. Have a mental health disorder such as anxiety or depression. How to recognize stress Stress can make you: Have trouble sleeping. Feel sad, anxious, irritable, or overwhelmed. Lose your appetite. Overeat or want to eat unhealthy foods. Want to use drugs or alcohol. Stress can also cause physical symptoms, such as: Sore, tense muscles, especially in the shoulders and neck. Headaches. Trouble breathing. A faster heart rate. Stomach pain, nausea, or vomiting. Diarrhea or constipation. Trouble concentrating. Follow these instructions at home: Eating and drinking Eat a healthy diet. This includes: Eating foods that are high in fiber, such as beans, whole grains, and fresh fruits and vegetables. Limiting foods that are high in fat and processed sugars, such as fried or sweet foods. Do not skip meals or overeat. Drink enough fluid to keep your urine pale yellow. Alcohol use Do not drink alcohol if: Your health care provider tells you not to  drink. You are pregnant, may be pregnant, or are planning to become pregnant. Drinking alcohol is a way some people try to ease their stress. This can be dangerous, so if you drink alcohol: Limit how much you have to: 0-1 drink a day for women. 0-2 drinks a day for men. Know how much alcohol is in your drink. In the [place] one drink equals one 12 oz bottle of beer (355 mL), one 5 oz glass of wine (148 mL), or one 1 oz glass of hard liquor (44 mL). Activity  Include 30 minutes of exercise in your daily schedule. Exercise is a good stress reducer. Include time in your day for an activity that you find relaxing. Try taking a walk, going on a bike ride, reading a book, or listening to music. Schedule your time in a way that lowers stress, and keep a regular schedule. Focus on doing what is most important to get done. Lifestyle Identify the source of your stress and your reaction to it. See a therapist who can help you change unhelpful reactions. When there are stressful events: Talk about them with family, friends, or coworkers. Try to think realistically about stressful events and not ignore them or overreact. Try to find the positives in a stressful situation and not focus on the negatives. Cut back on responsibilities at work and home, if possible. Ask for help from friends or family members if you need it. Find ways to manage stress, such as: Mindfulness, meditation, or deep breathing. Yoga or tai chi. Progressive muscle relaxation. Spending time in nature. Doing art, playing music, or reading. Making time for fun activities. Spending time with family and friends. Get support  from family, friends, or spiritual resources. General instructions Get enough sleep. Try to go to sleep and get up at about the same time every day. Take over-the-counter and prescription medicines only as told by your health care provider. Do not use any products that contain nicotine or tobacco. These products  include cigarettes, chewing tobacco, and vaping devices, such as e-cigarettes. If you need help quitting, ask your health care provider. Do not use drugs or smoke to deal with stress. Keep all follow-up visits. This is important. Where to find support Talk with your health care provider about stress management or finding a support group. Find a therapist to work with you on your stress management techniques. Where to find more information [employer] on Mental Illness: www.nami.org American Psychological Association: Dice[employer].ca Contact a health care provider if: Your stress symptoms get worse. You are unable to manage your stress at home. You are struggling to stop using drugs or alcohol. Get help right away if: You may be a danger to yourself or others. You have any thoughts of death or suicide. Get help right awayif you feel like you may hurt yourself or others, or have thoughts about taking your own life. Go to your nearest emergency room or: Call 911. Call the National Suicide Prevention Lifeline at (304)678-3299 or 988 in the [place] This is open 24 hours a day. If you're a Veteran: Call 988 and press 1. This is open 24 hours a day. Text the [employer] at 226-132-3352. Summary Feeling a certain amount of stress is normal, but severe or long-term (chronic) stress can affect your mental and physical health. Chronic stress can put you at higher risk for anxiety, depression, and other health problems such as digestive problems, muscle aches, heart disease, high blood pressure, and stroke. You may be at higher risk for stress-related problems if you do not get enough sleep, are in poor health, lack emotional support, or have a mental health disorder such as anxiety or depression. Identify the source of your stress and your reaction to it. Try talking about stressful events with family, friends, or coworkers, finding a coping method, or getting support from spiritual resources. If  you need more help, talk with your health care provider about finding a support group or a mental health therapist. This information is not intended to replace advice given to you by your health care provider. Make sure you discuss any questions you have with your health care provider. Document Revised: 05/11/2023 Document Reviewed: 04/20/2021 Elsevier Patient Education  2024 [employer].

## 2024-01-09 NOTE — Progress Notes (Signed)
 Virtual Visit via Video Note  I connected with Brandy House on 01/09/24 at  2:00 PM EDT by a video enabled telemedicine application and verified that I am speaking with the correct person using two identifiers.  Location: Patient: Home Provider: Office  Person's participating in this video call: Nicki Reaper, NP-C and Lucius Conn   I discussed the limitations of evaluation and management by telemedicine and the availability of in person appointments. The patient expressed understanding and agreed to proceed.  History of Present Illness:   Discussed the use of AI scribe software for clinical note transcription with the patient, who gave verbal consent to proceed.  Brandy House is a 23 year old female with anxiety and depression who presents with concerns about medication efficacy.  She is experiencing increased anxiety and a 'busy mind' despite being on Lexapro. She feels more tired and notes an increase in depressive symptoms over the past one to two months. Her Lexapro dosage was increased from 10 mg to 20 mg, but she feels it has not been effective.  She is currently taking 20 mg of Lexapro and continues to take Wellbutrin. She has a history of using hydroxyzine but has not tried other SSRIs beyond Lexapro. She is not currently engaged in therapy, although she previously attended sessions and was advised she was 'cleared enough to be out of therapy'.  No manic episodes or symptoms suggestive of bipolar disorder. She is uncertain if her symptoms are related to situational, life, work, or financial stressors.  Her family history includes anxiety and depression, with both parents taking medications for these conditions, though the specific medications are unknown.      No past medical history on file.  Current Outpatient Medications  Medication Sig Dispense Refill   buPROPion (WELLBUTRIN XL) 150 MG 24 hr tablet Take 1 tablet (150 mg total) by mouth daily. 90 tablet 3    escitalopram (LEXAPRO) 10 MG tablet Take 1 tablet (10 mg total) by mouth daily. 90 tablet 3   No current facility-administered medications for this visit.    No Known Allergies  No family history on file.  Social History   Socioeconomic History   Marital status: Single    Spouse name: Not on file   Number of children: Not on file   Years of education: Not on file   Highest education level: 12th grade  Occupational History   Not on file  Tobacco Use   Smoking status: Never   Smokeless tobacco: Never  Vaping Use   Vaping status: Never Used  Substance and Sexual Activity   Alcohol use: Yes    Comment: occ   Drug use: Never   Sexual activity: Not on file  Other Topics Concern   Not on file  Social History Narrative   Not on file   Social Drivers of Health   Financial Resource Strain: Low Risk  (01/05/2024)   Overall Financial Resource Strain (CARDIA)    Difficulty of Paying Living Expenses: Not hard at all  Food Insecurity: No Food Insecurity (01/05/2024)   Hunger Vital Sign    Worried About Running Out of Food in the Last Year: Never true    Ran Out of Food in the Last Year: Never true  Transportation Needs: No Transportation Needs (01/05/2024)   PRAPARE - Administrator, Civil Service (Medical): No    Lack of Transportation (Non-Medical): No  Physical Activity: Sufficiently Active (01/05/2024)   Exercise Vital Sign  Days of Exercise per Week: 5 days    Minutes of Exercise per Session: 30 min  Stress: Stress Concern Present (01/05/2024)   [employer] of Occupational Health - Occupational Stress Questionnaire    Feeling of Stress : Rather much  Social Connections: Moderately Isolated (01/05/2024)   Social Connection and Isolation Panel [NHANES]    Frequency of Communication with Friends and Family: Three times a week    Frequency of Social Gatherings with Friends and Family: Once a week    Attends Religious Services: Never    Database administrator  or Organizations: No    Attends Engineer, structural: Not on file    Marital Status: Living with partner  Intimate Partner Violence: Not At Risk (09/26/2022)   [facility]   HITS    Over the last 12 months how often did your partner physically hurt you?: Never    Over the last 12 months how often did your partner insult you or talk down to you?: Never    Over the last 12 months how often did your partner threaten you with physical harm?: Never    Over the last 12 months how often did your partner scream or curse at you?: Never     Constitutional: Denies fever, malaise, fatigue, headache or abrupt weight changes.  Respiratory: Denies difficulty breathing, shortness of breath, cough or sputum production.   Cardiovascular: Denies chest pain, chest tightness, palpitations or swelling in the hands or feet.  Musculoskeletal: Denies decrease in range of motion, difficulty with gait, muscle pain or joint pain and swelling.  Neurological: Denies dizziness, difficulty with memory, difficulty with speech or problems with balance and coordination.  Psych: Patient has a history of anxiety and depression.  Denies SI/HI.  No other specific complaints in a complete review of systems (except as listed in HPI above).  Observations/Objective:  Wt Readings from Last 3 Encounters:  02/22/23 177 lb (80.3 kg)  06/21/22 184 lb (83.5 kg)  10/15/21 189 lb 9.6 oz (86 kg)    General: Appears her stated age, well developed, well nourished in NAD. Pulmonary/Chest: Normal effort. No respiratory distress.  Neurological: Alert and oriented.  Psychiatric: Mood and affect normal. Anxious appearing. Judgment and thought content normal.     Assessment and Plan:  Assessment and Plan    Generalized Anxiety Disorder and Major Depressive Disorder Persistent anxiety and increased depressive symptoms over the past month or two, despite being on 20 mg of Lexapro. The current  dosage is ineffective. She is also on Wellbutrin. No family history of bipolar disorder or other mental illnesses beyond anxiety and depression. Switching from Lexapro to sertraline is considered, as she has not experienced manic episodes, ruling out bipolar depression. Checking if her parents are on any effective medications is suggested, as this could guide the choice of medication due to potential genetic factors. Sertraline is proposed as the next option, starting at 50 mg, with a maximum dose of 400 mg, although benefits are not always seen beyond 200 mg. - Switch from Lexapro 20 mg to sertraline 50 mg.. - Continue Wellbutrin. - Evaluate the effectiveness of the medication switch during the next appointment on May 15th.       RTC in 1 month for your annual exam  Follow Up Instructions:    I discussed the assessment and treatment plan with the patient. The patient was provided an opportunity to ask questions and all were answered. The patient agreed with  the plan and demonstrated an understanding of the instructions.   The patient was advised to call back or seek an in-person evaluation if the symptoms worsen or if the condition fails to improve as anticipated.   Nicki Reaper, NP

## 2024-02-22 ENCOUNTER — Encounter: Payer: Self-pay | Admitting: Internal Medicine

## 2024-02-28 ENCOUNTER — Ambulatory Visit (INDEPENDENT_AMBULATORY_CARE_PROVIDER_SITE_OTHER): Payer: Self-pay | Admitting: Internal Medicine

## 2024-02-28 ENCOUNTER — Other Ambulatory Visit (HOSPITAL_COMMUNITY)
Admission: RE | Admit: 2024-02-28 | Discharge: 2024-02-28 | Disposition: A | Discharge status: Home / Self Care | Payer: Self-pay | Source: Ambulatory Visit | Attending: Internal Medicine | Admitting: Internal Medicine

## 2024-02-28 ENCOUNTER — Encounter: Payer: Self-pay | Admitting: Internal Medicine

## 2024-02-28 VITALS — BP 112/72 | Ht 71.0 in | Wt 165.6 lb

## 2024-02-28 DIAGNOSIS — Z0000 Encounter for general adult medical examination without abnormal findings: Secondary | ICD-10-CM

## 2024-02-28 DIAGNOSIS — Z124 Encounter for screening for malignant neoplasm of cervix: Secondary | ICD-10-CM

## 2024-02-28 DIAGNOSIS — Z23 Encounter for immunization: Secondary | ICD-10-CM

## 2024-02-28 MED ORDER — BUPROPION HCL ER (SR) 100 MG PO TB12: 100.0000 mg | ORAL_TABLET | Freq: Two times a day (BID) | ORAL | 1 refills | Status: DC

## 2024-02-28 NOTE — Progress Notes (Signed)
 Subjective:    Patient ID: Brandy House, female    DOB: 01-28-2001, 23 y.o.   MRN: [id number]  HPI  Patient presents to clinic today for her annual exam.  Flu: never Tetanus: > 10 years ago COVID: x 2 Pap smear: never Dentist: as needed  Diet: She does eat some lean meat. She consumes fruits and veggies. She does eat some fried foods. She drinks mostly water, cherry coke. Exercise: Walking the dog  Review of Systems     No past medical history on file.  Current Outpatient Medications  Medication Sig Dispense Refill   buPROPion  (WELLBUTRIN  XL) 150 MG 24 hr tablet Take 1 tablet (150 mg total) by mouth daily. 90 tablet 3   sertraline  (ZOLOFT ) 50 MG tablet Take 1 tablet (50 mg total) by mouth daily. 90 tablet 0   No current facility-administered medications for this visit.    No Known Allergies  No family history on file.  Social History   Socioeconomic History   Marital status: Single    Spouse name: Not on file   Number of children: Not on file   Years of education: Not on file   Highest education level: 12th grade  Occupational History   Not on file  Tobacco Use   Smoking status: Never   Smokeless tobacco: Never  Vaping Use   Vaping status: Never Used  Substance and Sexual Activity   Alcohol use: Yes    Comment: occ   Drug use: Never   Sexual activity: Not on file  Other Topics Concern   Not on file  Social History Narrative   Not on file   Social Drivers of Health   Financial Resource Strain: Low Risk  (01/05/2024)   Overall Financial Resource Strain (CARDIA)    Difficulty of Paying Living Expenses: Not hard at all  Food Insecurity: No Food Insecurity (01/05/2024)   Hunger Vital Sign    Worried About Running Out of Food in the Last Year: Never true    Ran Out of Food in the Last Year: Never true  Transportation Needs: No Transportation Needs (01/05/2024)   PRAPARE - Administrator, Civil Service (Medical): No    Lack of  Transportation (Non-Medical): No  Physical Activity: Sufficiently Active (01/05/2024)   Exercise Vital Sign    Days of Exercise per Week: 5 days    Minutes of Exercise per Session: 30 min  Stress: Stress Concern Present (01/05/2024)   [employer] of Occupational Health - Occupational Stress Questionnaire    Feeling of Stress : Rather much  Social Connections: Moderately Isolated (01/05/2024)   Social Connection and Isolation Panel [NHANES]    Frequency of Communication with Friends and Family: Three times a week    Frequency of Social Gatherings with Friends and Family: Once a week    Attends Religious Services: Never    Database administrator or Organizations: No    Attends Engineer, structural: Not on file    Marital Status: Living with partner  Intimate Partner Violence: Not At Risk (09/26/2022)   Received from [facility]   HITS    Over the last 12 months how often did your partner physically hurt you?: Never    Over the last 12 months how often did your partner insult you or talk down to you?: Never    Over the last 12 months how often did your partner threaten you with physical harm?: Never  Over the last 12 months how often did your partner scream or curse at you?: Never     Constitutional: Denies fever, malaise, fatigue, headache or abrupt weight changes.  HEENT: Denies eye pain, eye redness, ear pain, ringing in the ears, wax buildup, runny nose, nasal congestion, bloody nose, or sore throat. Respiratory: Denies difficulty breathing, shortness of breath, cough or sputum production.   Cardiovascular: Denies chest pain, chest tightness, palpitations or swelling in the hands or feet.  Gastrointestinal: Denies abdominal pain, bloating, constipation, diarrhea or blood in the stool.  GU: Denies urgency, frequency, pain with urination, burning sensation, blood in urine, odor or discharge. Musculoskeletal: Denies decrease in range of motion, difficulty  with gait, muscle pain or joint pain and swelling.  Skin: Denies redness, rashes, lesions or ulcercations.  Neurological: Denies dizziness, difficulty with memory, difficulty with speech or problems with balance and coordination.  Psych: Patient has a history of anxiety and depression.  Denies SI/HI.  No other specific complaints in a complete review of systems (except as listed in HPI above).  Objective:   Physical Exam   BP 112/72 (BP Location: Right Arm, Patient Position: Sitting, Cuff Size: Normal)   Ht 5\' 11"  (1.803 m)   Wt 165 lb 9.6 oz (75.1 kg)   LMP 02/24/2024 (Approximate)   BMI 23.10 kg/m    General: Appears her stated age, well developed, well nourished in NAD. Skin: Warm, dry and intact.  HEENT: Head: normal shape and size; Eyes: sclera white, no icterus, conjunctiva pink, PERRLA and EOMs intact;  Neck:  Neck supple, trachea midline. No masses, lumps or thyromegaly present.  Cardiovascular: Tachycardic with normal rhythm. S1,S2 noted.  No murmur, rubs or gallops noted. No JVD or BLE edema.  Pulmonary/Chest: Normal effort and positive vesicular breath sounds. No respiratory distress. No wheezes, rales or ronchi noted.  Abdomen: Normal bowel sounds.  Pelvic: Normal female anatomy. Small amount of blood noted in the vaginal vault. Cervix without mass or lesion.  No CMT.  Adnexa nonpalpable. Musculoskeletal: Strength 5/5 BUE/BLE. No difficulty with gait.  Neurological: Alert and oriented. Cranial nerves II-XII grossly intact. Coordination normal.  Psychiatric: Mood and affect normal. Behavior is normal. Judgment and thought content normal.         Assessment & Plan:   Preventative Health Maintenance:  Encouraged her to get a flu shot in the fall Tdap today Encouraged her to get her COVID-vaccine Pap smear declined today Encouraged her to consume a balanced diet and exercise regimen Advised her to see a dentist annually Labwork declined today  RTC in 1 year for  your annual exam Helayne Lo, NP

## 2024-02-28 NOTE — Patient Instructions (Signed)

## 2024-03-06 ENCOUNTER — Ambulatory Visit: Payer: Self-pay | Admitting: Internal Medicine

## 2024-03-06 LAB — CYTOLOGY - PAP
Comment: NEGATIVE
Diagnosis: NEGATIVE
High risk HPV: NEGATIVE

## 2024-04-01 ENCOUNTER — Other Ambulatory Visit: Payer: Self-pay | Admitting: Internal Medicine

## 2024-04-02 NOTE — Telephone Encounter (Signed)
 Too soon for refill.  Requested Prescriptions  Pending Prescriptions Disp Refills   sertraline  (ZOLOFT ) 50 MG tablet [Pharmacy Med Name: SERTRALINE  HCL 50 MG TABLET] 90 tablet 0    Sig: TAKE 1 TABLET BY MOUTH DAILY     Psychiatry:  Antidepressants - SSRI - sertraline  Failed - 04/02/2024  4:19 PM      Failed - AST in normal range and within 360 days    No results found for: POCAST, AST       Failed - ALT in normal range and within 360 days    No results found for: ALT, LABALT, POCALT       Passed - Completed PHQ-2 or PHQ-9 in the last 360 days      Passed - Valid encounter within last 6 months    Recent Outpatient Visits           1 month ago Encounter for general adult medical examination w/o abnormal findings   [place] [facility] [place], Angeline ORN, NP   2 months ago Anxiety and depression   [place] [facility] [place], Angeline ORN, [place]

## 2024-04-05 ENCOUNTER — Other Ambulatory Visit: Payer: Self-pay | Admitting: Internal Medicine

## 2024-04-05 NOTE — Telephone Encounter (Signed)
 Requested medication (s) are due for refill today - yes  Requested medication (s) are on the active medication list -yes  Future visit scheduled -no  Last refill: 01/09/24 #90  Notes to clinic: fails lab protocol- missing labs  Requested Prescriptions  Pending Prescriptions Disp Refills   sertraline  (ZOLOFT ) 50 MG tablet [Pharmacy Med Name: SERTRALINE  HCL 50 MG TABLET] 90 tablet 0    Sig: TAKE 1 TABLET BY MOUTH DAILY     Psychiatry:  Antidepressants - SSRI - sertraline  Failed - 04/05/2024  4:23 PM      Failed - AST in normal range and within 360 days    No results found for: POCAST, AST       Failed - ALT in normal range and within 360 days    No results found for: ALT, LABALT, POCALT       Passed - Completed PHQ-2 or PHQ-9 in the last 360 days      Passed - Valid encounter within last 6 months    Recent Outpatient Visits           1 month ago Encounter for general adult medical examination w/o abnormal findings   [place] [facility] [place], [place], NP   2 months ago Anxiety and depression   [place] [facility] [place], Angeline ORN, NP                 Requested Prescriptions  Pending Prescriptions Disp Refills   sertraline  (ZOLOFT ) 50 MG tablet [Pharmacy Med Name: SERTRALINE  HCL 50 MG TABLET] 90 tablet 0    Sig: TAKE 1 TABLET BY MOUTH DAILY     Psychiatry:  Antidepressants - SSRI - sertraline  Failed - 04/05/2024  4:23 PM      Failed - AST in normal range and within 360 days    No results found for: POCAST, AST       Failed - ALT in normal range and within 360 days    No results found for: ALT, LABALT, POCALT       Passed - Completed PHQ-2 or PHQ-9 in the last 360 days      Passed - Valid encounter within last 6 months    Recent Outpatient Visits           1 month ago Encounter for general adult medical examination w/o abnormal findings   [place] [facility] [place], Angeline ORN, NP   2  months ago Anxiety and depression    [facility] [place], Angeline ORN, [place]

## 2024-07-01 ENCOUNTER — Other Ambulatory Visit: Payer: Self-pay | Admitting: Internal Medicine

## 2024-07-02 NOTE — Telephone Encounter (Signed)
 Requested medications are due for refill today.  yes  Requested medications are on the active medications list.  yes  Last refill. 04/05/2024 #90 0 rf  Future visit scheduled.   no  Notes to clinic.  Missing labs.    Requested Prescriptions  Pending Prescriptions Disp Refills   sertraline  (ZOLOFT ) 50 MG tablet [Pharmacy Med Name: SERTRALINE  HCL 50 MG TABLET] 90 tablet 0    Sig: TAKE 1 TABLET BY MOUTH DAILY     Psychiatry:  Antidepressants - SSRI - sertraline  Failed - 07/02/2024  9:58 AM      Failed - AST in normal range and within 360 days    No results found for: POCAST, AST       Failed - ALT in normal range and within 360 days    No results found for: ALT, LABALT, POCALT       Passed - Completed PHQ-2 or PHQ-9 in the last 360 days      Passed - Valid encounter within last 6 months    Recent Outpatient Visits           4 months ago Encounter for general adult medical examination w/o abnormal findings   [place] [facility] [place], Angeline ORN, NP   5 months ago Anxiety and depression   [place] [facility] [place], Angeline ORN, [place]

## 2024-08-29 ENCOUNTER — Other Ambulatory Visit: Payer: Self-pay | Admitting: Internal Medicine

## 2024-08-30 NOTE — Telephone Encounter (Signed)
 Requested by interface surescripts. Courtesy refill.  Requested Prescriptions  Pending Prescriptions Disp Refills   buPROPion  ER (WELLBUTRIN  SR) 100 MG 12 hr tablet [Pharmacy Med Name: buPROPion  HCL SR 100 MG TABLET] 180 tablet 0    Sig: TAKE 1 TABLET BY MOUTH 2 TIMES A DAY     Psychiatry: Antidepressants - bupropion  Failed - 08/30/2024  4:52 PM      Failed - Cr in normal range and within 360 days    No results found for: CREATININE, LABCREAU, LABCREA, POCCRE       Failed - AST in normal range and within 360 days    No results found for: POCAST, AST       Failed - ALT in normal range and within 360 days    No results found for: ALT, LABALT, POCALT       Failed - Valid encounter within last 6 months    Recent Outpatient Visits           6 months ago Encounter for general adult medical examination w/o abnormal findings   [place] [facility] [place], Angeline ORN, NP   7 months ago Anxiety and depression    [facility] [place], [place], [place]              Passed - Completed PHQ-2 or PHQ-9 in the last 360 days      Passed - Last BP in normal range    BP Readings from Last 1 Encounters:  02/28/24 112/72

## 2024-08-30 NOTE — Telephone Encounter (Signed)
Called patient to schedule appt for medication refills. No answer, LVMTCB #[phone]. 

## 2024-09-29 ENCOUNTER — Other Ambulatory Visit: Payer: Self-pay | Admitting: Internal Medicine

## 2024-10-02 NOTE — Telephone Encounter (Signed)
 Requested medication (s) are due for refill today - yes  Requested medication (s) are on the active medication list -yes  Future visit scheduled -no  Last refill: 07/02/24 #90  Notes to clinic: Attempted to call patient to schedule follow up appointment- left message to call office. Fails lab protocol- no results found. Sent for provider review of request.   Requested Prescriptions  Pending Prescriptions Disp Refills   sertraline  (ZOLOFT ) 50 MG tablet [Pharmacy Med Name: SERTRALINE  HCL 50 MG TABLET] 90 tablet 0    Sig: TAKE 1 TABLET BY MOUTH DAILY     Psychiatry:  Antidepressants - SSRI - sertraline  Failed - 10/02/2024 10:12 AM      Failed - AST in normal range and within 360 days    No results found for: POCAST, AST       Failed - ALT in normal range and within 360 days    No results found for: ALT, LABALT, POCALT       Failed - Valid encounter within last 6 months    Recent Outpatient Visits           7 months ago Encounter for general adult medical examination w/o abnormal findings   [place] [facility] [place], [place], NP   8 months ago Anxiety and depression   [place] [facility] [place], Angeline ORN, NP              Passed - Completed PHQ-2 or PHQ-9 in the last 360 days         Requested Prescriptions  Pending Prescriptions Disp Refills   sertraline  (ZOLOFT ) 50 MG tablet [Pharmacy Med Name: SERTRALINE  HCL 50 MG TABLET] 90 tablet 0    Sig: TAKE 1 TABLET BY MOUTH DAILY     Psychiatry:  Antidepressants - SSRI - sertraline  Failed - 10/02/2024 10:12 AM      Failed - AST in normal range and within 360 days    No results found for: POCAST, AST       Failed - ALT in normal range and within 360 days    No results found for: ALT, LABALT, POCALT       Failed - Valid encounter within last 6 months    Recent Outpatient Visits           7 months ago Encounter for general adult medical examination w/o abnormal  findings   [place] [facility] [place], Angeline ORN, NP   8 months ago Anxiety and depression   [place] [facility] [place], [place], [place]              Passed - Completed PHQ-2 or PHQ-9 in the last 360 days
